# Patient Record
Sex: Male | Born: 1975 | Hispanic: Yes | Marital: Married | Smoking: Never smoker
Health system: Southern US, Community
[De-identification: ages and names within clinical notes are randomized; demographics above are authoritative.]

---

## 2011-12-09 ENCOUNTER — Emergency Department: Payer: Self-pay | Admitting: Emergency Medicine

## 2011-12-09 LAB — COMPREHENSIVE METABOLIC PANEL
Albumin: 4 g/dL (ref 3.4–5.0)
Alkaline Phosphatase: 97 U/L (ref 50–136)
BUN: 13 mg/dL (ref 7–18)
Bilirubin,Total: 0.4 mg/dL (ref 0.2–1.0)
Co2: 25 mmol/L (ref 21–32)
Creatinine: 0.81 mg/dL (ref 0.60–1.30)
EGFR (African American): >60
Glucose: 99 mg/dL (ref 65–99)
SGPT (ALT): 39 U/L (ref 12–78)
Sodium: 140 mmol/L (ref 136–145)
Total Protein: 7.5 g/dL (ref 6.4–8.2)

## 2011-12-09 LAB — URINALYSIS, COMPLETE
Bacteria: NONE SEEN
Blood: NEGATIVE
Glucose,UR: NEGATIVE mg/dL (ref 0–75)
Leukocyte Esterase: NEGATIVE
Nitrite: NEGATIVE
Ph: 5 (ref 4.5–8.0)
Protein: NEGATIVE
Specific Gravity: 1.02 (ref 1.003–1.030)

## 2011-12-09 LAB — CK TOTAL AND CKMB (NOT AT ARMC)
CK, Total: 258 U/L — ABNORMAL HIGH (ref 35–232)
CK-MB: 2.1 ng/mL (ref 0.5–3.6)

## 2011-12-09 LAB — CBC
HCT: 47.2 % (ref 40.0–52.0)
HGB: 16.5 g/dL (ref 13.0–18.0)
MCHC: 34.9 g/dL (ref 32.0–36.0)
MCV: 90 fL (ref 80–100)
Platelet: 263 10*3/uL (ref 150–440)
RBC: 5.23 10*6/uL (ref 4.40–5.90)
RDW: 13.3 % (ref 11.5–14.5)
WBC: 5.7 10*3/uL (ref 3.8–10.6)

## 2011-12-09 LAB — TROPONIN I
Troponin-I: <0.02 ng/mL
Troponin-I: <0.02 ng/mL

## 2011-12-09 LAB — PROTIME-INR
INR: 0.9
Prothrombin Time: 12.2 secs (ref 11.5–14.7)

## 2011-12-09 LAB — PHOSPHORUS: Phosphorus: 3 mg/dL (ref 2.5–4.9)

## 2013-10-26 IMAGING — CR DG CHEST 2V
1 series · 2 of 2 positions shown · non-contrast
Comparison: none

REASON FOR EXAM: cp
COMMENTS:   May transport without cardiac monitor

PROCEDURE:     DXR - DXR CHEST PA (OR AP) AND LATERAL  - December 09, 2011  [DATE]
RESULT:     Comparison: None

[Series 1: pa · 0.17mm/px · 2 of 2 slices shown]
[im 1/2]
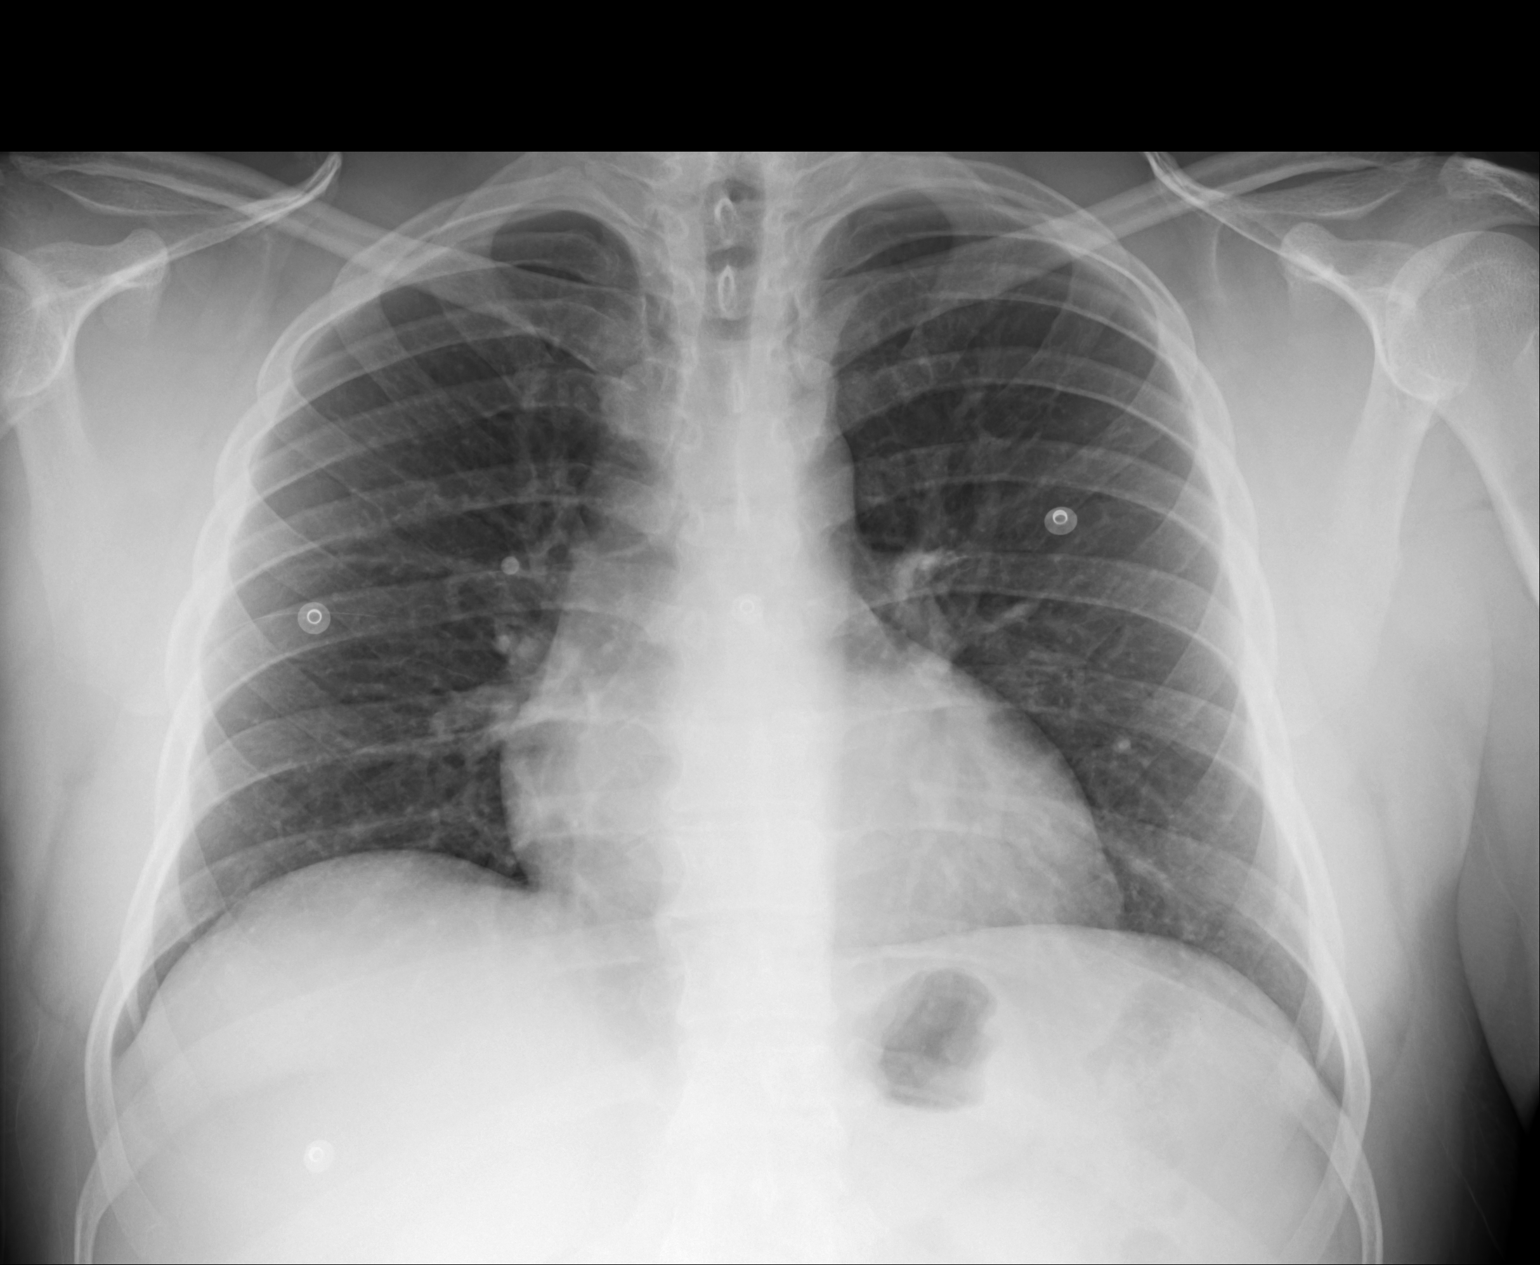
[im 2/2]
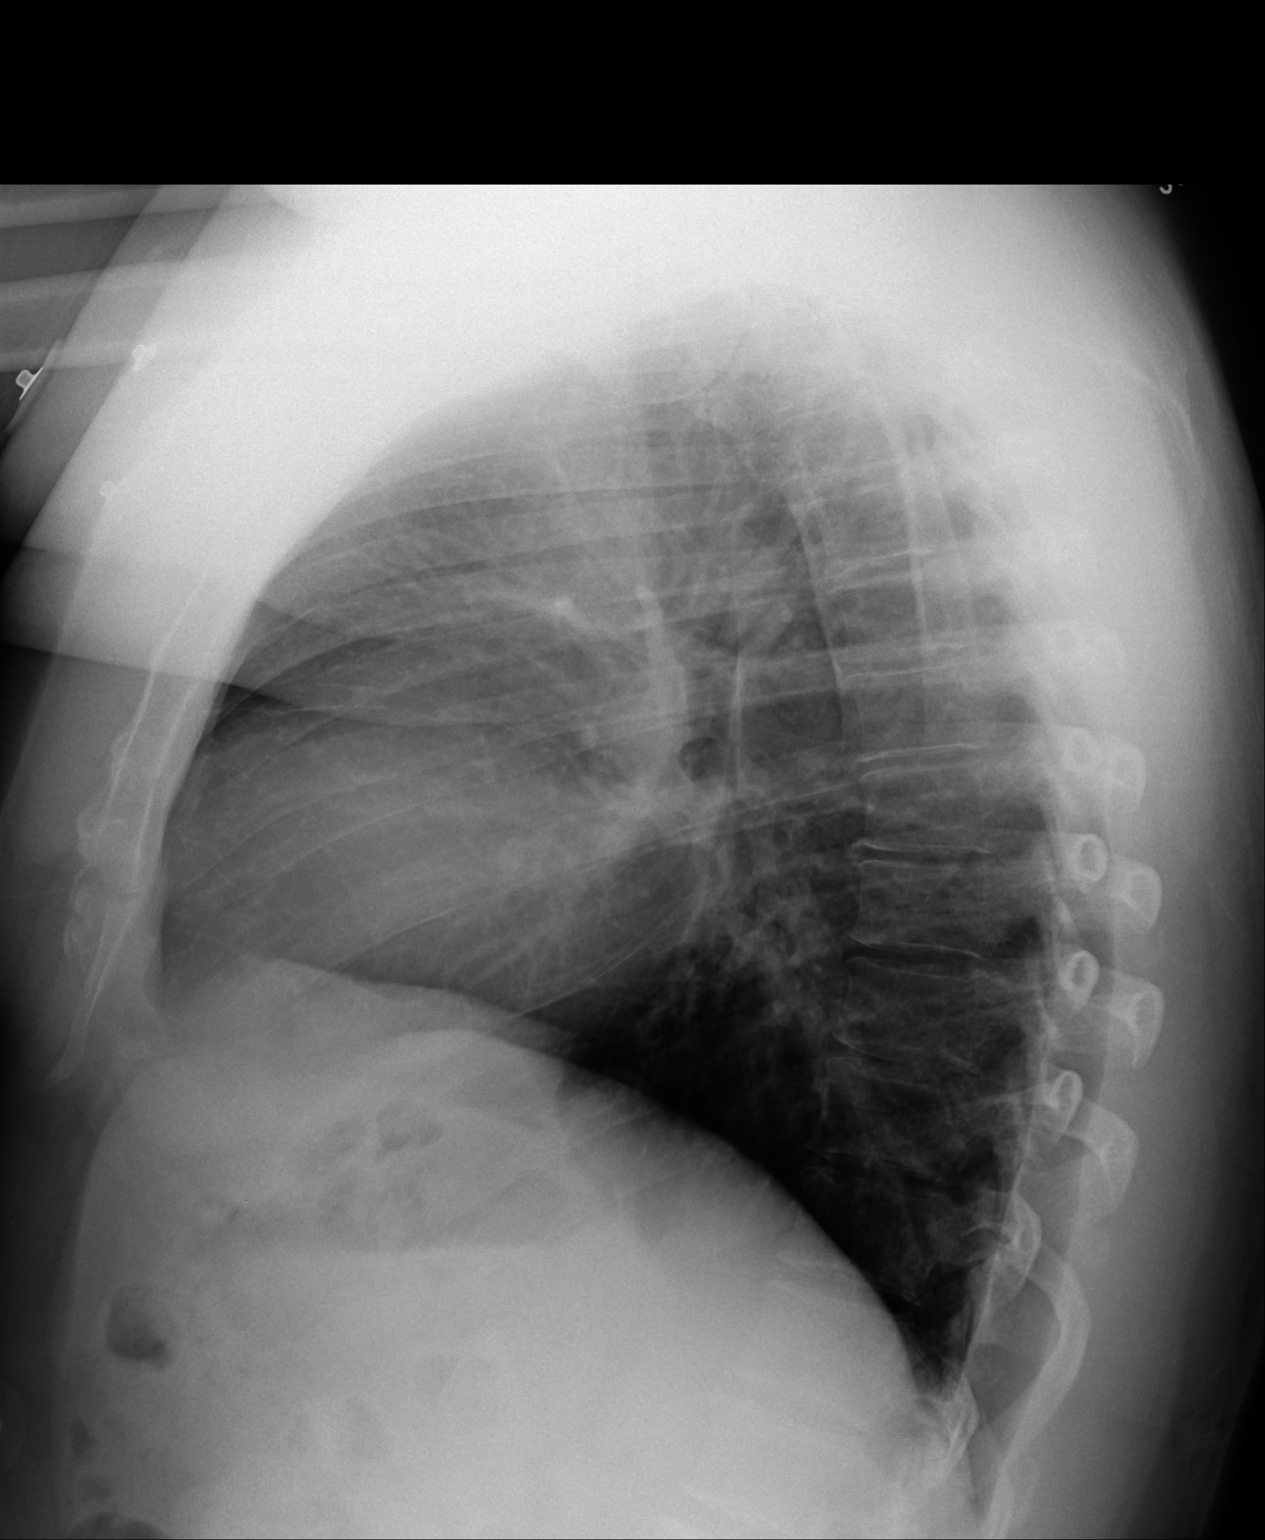

[2 of 2 positions shown; findings below may reference images not displayed]

FINDINGS: PA and lateral chest radiographs are provided.  There is no focal
parenchymal opacity, pleural effusion, or pneumothorax. The heart and
mediastinum are unremarkable.  The osseous structures are unremarkable.
IMPRESSION: No acute disease of the che[REDACTED]

## 2013-11-01 ENCOUNTER — Emergency Department: Payer: Self-pay | Admitting: Emergency Medicine

## 2013-11-01 LAB — URINALYSIS, COMPLETE
BILIRUBIN, UR: NEGATIVE
Glucose,UR: NEGATIVE mg/dL (ref 0–75)
Ketone: NEGATIVE
Leukocyte Esterase: NEGATIVE
Nitrite: NEGATIVE
Ph: 8 (ref 4.5–8.0)
Protein: 30
Specific Gravity: 1.024 (ref 1.003–1.030)
Squamous Epithelial: 5

## 2013-11-01 LAB — CBC
HCT: 44.3 % (ref 40.0–52.0)
HGB: 15.1 g/dL (ref 13.0–18.0)
MCH: 31.1 pg (ref 26.0–34.0)
MCHC: 34 g/dL (ref 32.0–36.0)
MCV: 92 fL (ref 80–100)
PLATELETS: 278 10*3/uL (ref 150–440)
RBC: 4.84 10*6/uL (ref 4.40–5.90)
RDW: 13.1 % (ref 11.5–14.5)
WBC: 7.9 10*3/uL (ref 3.8–10.6)

## 2013-11-01 LAB — COMPREHENSIVE METABOLIC PANEL
ALK PHOS: 68 U/L
Albumin: 3.8 g/dL (ref 3.4–5.0)
Anion Gap: 9 (ref 7–16)
BUN: 13 mg/dL (ref 7–18)
Bilirubin,Total: 0.5 mg/dL (ref 0.2–1.0)
CALCIUM: 8.5 mg/dL (ref 8.5–10.1)
Chloride: 101 mmol/L (ref 98–107)
Co2: 31 mmol/L (ref 21–32)
Creatinine: 1.21 mg/dL (ref 0.60–1.30)
EGFR (African American): >60
EGFR (Non-African Amer.): >60
Glucose: 145 mg/dL — ABNORMAL HIGH (ref 65–99)
OSMOLALITY: 284 (ref 275–301)
POTASSIUM: 3.4 mmol/L — AB (ref 3.5–5.1)
SGOT(AST): 35 U/L (ref 15–37)
SGPT (ALT): 49 U/L
Sodium: 141 mmol/L (ref 136–145)
Total Protein: 7.4 g/dL (ref 6.4–8.2)

## 2015-10-16 ENCOUNTER — Emergency Department: Payer: Self-pay

## 2015-10-16 ENCOUNTER — Encounter: Payer: Self-pay | Admitting: Emergency Medicine

## 2015-10-16 ENCOUNTER — Emergency Department
Admission: EM | Admit: 2015-10-16 | Discharge: 2015-10-16 | Disposition: A | Discharge status: Home / Self Care | Payer: Self-pay | Attending: Emergency Medicine | Admitting: Emergency Medicine

## 2015-10-16 DIAGNOSIS — G510 Bell's palsy: Secondary | ICD-10-CM | POA: Insufficient documentation

## 2015-10-16 DIAGNOSIS — H11001 Unspecified pterygium of right eye: Secondary | ICD-10-CM | POA: Insufficient documentation

## 2015-10-16 LAB — CBC WITH DIFFERENTIAL/PLATELET
Basophils Absolute: 0 10*3/uL (ref 0–0.1)
Basophils Relative: 0 %
EOS PCT: 3 %
Eosinophils Absolute: 0.2 10*3/uL (ref 0–0.7)
HEMATOCRIT: 44.4 % (ref 40.0–52.0)
Hemoglobin: 15.7 g/dL (ref 13.0–18.0)
LYMPHS ABS: 1.7 10*3/uL (ref 1.0–3.6)
LYMPHS PCT: 30 %
MCH: 31.8 pg (ref 26.0–34.0)
MCHC: 35.4 g/dL (ref 32.0–36.0)
MCV: 89.8 fL (ref 80.0–100.0)
MONO ABS: 0.4 10*3/uL (ref 0.2–1.0)
Monocytes Relative: 8 %
Neutro Abs: 3.4 10*3/uL (ref 1.4–6.5)
Neutrophils Relative %: 59 %
PLATELETS: 246 10*3/uL (ref 150–440)
RBC: 4.94 MIL/uL (ref 4.40–5.90)
RDW: 13 % (ref 11.5–14.5)
WBC: 5.9 10*3/uL (ref 3.8–10.6)

## 2015-10-16 LAB — COMPREHENSIVE METABOLIC PANEL
ALBUMIN: 4.2 g/dL (ref 3.5–5.0)
ALT: 56 U/L (ref 17–63)
AST: 35 U/L (ref 15–41)
Alkaline Phosphatase: 60 U/L (ref 38–126)
Anion gap: 7 (ref 5–15)
BUN: 14 mg/dL (ref 6–20)
CHLORIDE: 105 mmol/L (ref 101–111)
CO2: 25 mmol/L (ref 22–32)
Calcium: 9.2 mg/dL (ref 8.9–10.3)
Creatinine, Ser: 0.79 mg/dL (ref 0.61–1.24)
GFR calc Af Amer: >60 mL/min (ref >60)
GFR calc non Af Amer: >60 mL/min (ref >60)
GLUCOSE: 145 mg/dL — AB (ref 65–99)
POTASSIUM: 3.5 mmol/L (ref 3.5–5.1)
SODIUM: 137 mmol/L (ref 135–145)
Total Bilirubin: 0.6 mg/dL (ref 0.3–1.2)
Total Protein: 6.9 g/dL (ref 6.5–8.1)

## 2015-10-16 MED ORDER — TETRACAINE HCL 0.5 % OP SOLN
1.0000 [drp] | Freq: Once | OPHTHALMIC | Status: AC
  Administered 2015-10-16: 1 [drp] via OPHTHALMIC
  Filled 2015-10-16: qty 2

## 2015-10-16 MED ORDER — HYPROMELLOSE (GONIOSCOPIC) 2.5 % OP SOLN: 1.0000 [drp] | Freq: Four times a day (QID) | OPHTHALMIC | 12 refills | Status: AC

## 2015-10-16 MED ORDER — PREDNISONE 20 MG PO TABS
60.0000 mg | ORAL_TABLET | Freq: Once | ORAL | Status: AC
  Administered 2015-10-16: 60 mg via ORAL
  Filled 2015-10-16: qty 3

## 2015-10-16 MED ORDER — VALACYCLOVIR HCL 1 G PO TABS: 1000.0000 mg | ORAL_TABLET | Freq: Three times a day (TID) | ORAL | 0 refills | Status: AC

## 2015-10-16 MED ORDER — FLUORESCEIN SODIUM 1 MG OP STRP
1.0000 | ORAL_STRIP | Freq: Once | OPHTHALMIC | Status: AC
  Administered 2015-10-16: 1 via OPHTHALMIC
  Filled 2015-10-16: qty 1

## 2015-10-16 MED ORDER — PREDNISONE 20 MG PO TABS: 60.0000 mg | ORAL_TABLET | Freq: Every day | ORAL | 0 refills | Status: AC

## 2015-10-16 MED ORDER — IBUPROFEN 600 MG PO TABS
600.0000 mg | ORAL_TABLET | Freq: Once | ORAL | Status: AC
  Administered 2015-10-16: 600 mg via ORAL
  Filled 2015-10-16: qty 1

## 2015-10-16 NOTE — ED Notes (Signed)

## 2015-10-16 NOTE — ED Provider Notes (Signed)
[facility] Emergency Department Provider Note  ____________________________________________  Time seen: Approximately 7:44 PM  I have reviewed the triage vital signs and the nursing notes.   HISTORY  Chief Complaint Migraine and Numbness   HPI Richard Daniels is a 40 y.o. male no significant past medical history who presents for evaluation of headache and facial droop. Patient reports 2 days of pain surrounding his right ear and his right eye, moderate to severe, associated with photophobia, burning sensation to his eye, and redness of his eye. He denies any foreign body sensation or trauma to his eye or changes in vision. Today at 5PM he noticed numbness to the right side of his face and when he looked in the mirror he noticed facial droop. He denies any history of shingles or chickenpox or herpes, any rash or fever. He does not smoke. No family history of stroke. No tick bites.  History reviewed. No pertinent past medical history.  There are no active problems to display for this patient.   History reviewed. No pertinent surgical history.  Prior to Admission medications   Medication Sig Start Date End Date Taking? Authorizing Provider  hydroxypropyl methylcellulose / hypromellose (ISOPTO TEARS / GONIOVISC) 2.5 % ophthalmic solution Place 1 drop into the right eye 4 (four) times daily. 10/16/15   Nita Sickle, MD  predniSONE (DELTASONE) 20 MG tablet Take 3 tablets (60 mg total) by mouth daily. 10/16/15 10/23/15  Nita Sickle, MD  valACYclovir (VALTREX) 1000 MG tablet Take 1 tablet (1,000 mg total) by mouth 3 (three) times daily. 10/16/15 10/23/15  Nita Sickle, MD    Allergies Review of patient's allergies indicates no known allergies.  No family history on file.  Social History Social History  Substance Use Topics  . Smoking status: Never Smoker  . Smokeless tobacco: Not on file  . Alcohol use No    Review of  Systems  Constitutional: Negative for fever. Eyes: Negative for visual changes. + eye burning, pain, and redness ENT: Negative for sore throat. Cardiovascular: Negative for chest pain. Respiratory: Negative for shortness of breath. Gastrointestinal: Negative for abdominal pain, vomiting or diarrhea. Genitourinary: Negative for dysuria. Musculoskeletal: Negative for back pain. Skin: Negative for rash. Neurological: Negative for headaches, weakness or numbness. + R facial droop  ____________________________________________   PHYSICAL EXAM:  VITAL SIGNS: ED Triage Vitals [10/16/15 1905]  Enc Vitals Group     BP (!) 144/79     Pulse Rate 90     Resp 18     Temp 97.7 F (36.5 C)     Temp Source Oral     SpO2 97 %     Weight 240 lb (108.9 kg)     Height 5\' 7"  (1.702 m)     Head Circumference      Peak Flow      Pain Score 8     Pain Loc      Pain Edu?      Excl. in GC?     Constitutional: Alert and oriented. Well appearing and in no apparent distress. HEENT:      Head: Normocephalic and atraumatic.         EYE EXAM: EOMI and not painful. PERRL bilaterally. Intact consensual light reflex. R sided photophobia. Conjunctivae is injected in the right with pterygium. No edema . Sclerae in anicteric. Eye lid everted and no foreign objects or stye observed. Visual fields are intact. Visual acuity is 20/70 on the right and 20/50 on the  left. No stye or chalazion. Ocular pressure 14 on the R eye. No fluorescin uptake observed with Wood's lamp. No blepharitis. No erythema surrounding the eye      Mouth/Throat: Mucous membranes are moist.       Ear: External ear canal clear with no infection, TM normal      Neck: Supple with no signs of meningismus. Cardiovascular: Regular rate and rhythm. No murmurs, gallops, or rubs. 2+ symmetrical distal pulses are present in all extremities. No JVD. Respiratory: Normal respiratory effort. Lungs are clear to auscultation bilaterally. No wheezes,  crackles, or rhonchi.  Gastrointestinal: Soft, non tender, and non distended with positive bowel sounds. No rebound or guarding. Genitourinary: No CVA tenderness. Musculoskeletal: Nontender with normal range of motion in all extremities. No edema, cyanosis, or erythema of extremities. Neurologic: R sided facial droop sparing the forehead. A & O x3, PERRL, no nystagmus, motor testing reveals good tone and bulk throughout. There is no evidence of pronator drift or dysmetria. Muscle strength is 5/5 throughout. Deep tendon reflexes are 2+ throughout with downgoing toes. Sensory examination is intact. Gait is normal. Skin: Skin is warm, dry and intact. No rash noted. Psychiatric: Mood and affect are normal. Speech and behavior are normal.  ____________________________________________   LABS (all labs ordered are listed, but only abnormal results are displayed)  Labs Reviewed  COMPREHENSIVE METABOLIC PANEL - Abnormal; Notable for the following:       Result Value   Glucose, Bld 145 (*)    All other components within normal limits  CBC WITH DIFFERENTIAL/PLATELET  VARICELLA ZOSTER ANTIBODY, IGG  B. BURGDORFI ANTIBODIES   ____________________________________________  EKG  none ____________________________________________  RADIOLOGY  Head CT: negative  ____________________________________________   PROCEDURES  Procedure(s) performed: None Procedures Critical Care performed:  None ____________________________________________   INITIAL IMPRESSION / ASSESSMENT AND PLAN / ED COURSE  40 y.o. male no significant past medical history who presents for evaluation of forehead sparring R sided facial droop in the setting of two days of R eye pain, redness, and burning. Patient with pterygium on exam and bell's palsy concerning for  Herpes zoster ophthalmicus. NO rash, ear exam normal.    Clinical Course  Comment By Time  Patient evaluated by Dr. Willey Blade, ophthalmology who agrees  with no dendritic findings on the cornea and recommended treating the Bell's palsy and following up with him in the clinic in a week for reevaluation. Also recommended sending titers for Lyme disease and VZV which were sent. Patient be discharged home with acyclovir, prednisone, and eye care. Nita Sickle, MD 07/31 2119    Pertinent labs & imaging results that were available during my care of the patient were reviewed by me and considered in my medical decision making (see chart for details).    ____________________________________________   FINAL CLINICAL IMPRESSION(S) / ED DIAGNOSES  Final diagnoses:  Bell's palsy  Pterygium eye, right      NEW MEDICATIONS STARTED DURING THIS VISIT:  New Prescriptions   HYDROXYPROPYL METHYLCELLULOSE / HYPROMELLOSE (ISOPTO TEARS / GONIOVISC) 2.5 % OPHTHALMIC SOLUTION    Place 1 drop into the right eye 4 (four) times daily.   PREDNISONE (DELTASONE) 20 MG TABLET    Take 3 tablets (60 mg total) by mouth daily.   VALACYCLOVIR (VALTREX) 1000 MG TABLET    Take 1 tablet (1,000 mg total) by mouth 3 (three) times daily.     Note:  This document was prepared using Dragon voice recognition software and may include unintentional dictation  errors.    Nita Sickle, MD 10/16/15 2133

## 2015-10-16 NOTE — Discharge Instructions (Signed)
Tome prednisona y valaciclovir segn lo prescrito. El cuidado de los ojos es lo ms importante para prevenir lesiones en el ojo. Aplique gotas artificiales en el ojo derecho varias veces al da. Por la noche, aplique gotas y [place] su ojo con un escudo para Company secretary. Si sus sntomas estn empeorando o si tiene cambios en la visin, debe realizar un seguimiento con la oftalmologa de forma inesperada, de lo contrario seguir con ellos en una semana para su reevaluacin.   Take prednisone and valacyclovir as prescribed.  Eye care is the most important thing to prevent injury to your eye. Apply artificial drops to the right eye multiple times a day. At night, apply drops and cover your eye with a shield to prevent injury. If your symptoms are getting worse or you have changes in vision you should follow up with ophthalmology emergently otherwise follow up with them in 1 week for re-evaluation.

## 2015-10-16 NOTE — ED Triage Notes (Signed)
Headache x 2 days. No head injury. States R eye reddened and watery x 2 days. Now noted with R facial weakness, R sided mouth droop, R side unable to to raise brow. Grips equal.

## 2015-10-18 LAB — B. BURGDORFI ANTIBODIES

## 2015-10-18 LAB — VARICELLA ZOSTER ANTIBODY, IGG: VARICELLA IGG: 632 {index} (ref >165)

## 2017-09-02 IMAGING — CT CT HEAD W/O CM
3 series · 16 of 47 positions shown, 19 images · non-contrast
Comparison: None.

CLINICAL DATA: Headache for 3 days. Right facial droop today.
Initial encounter.

EXAM:
CT HEAD WITHOUT CONTRAST
TECHNIQUE: Contiguous axial images were obtained from the base of the skull
through the vertex without intravenous contrast.

[Series 2: head wo · axial · 0.49mm/px · z∈[-131,-6]mm · 10 of 30 slices shown, 13 images]
[im 3/30  brain]
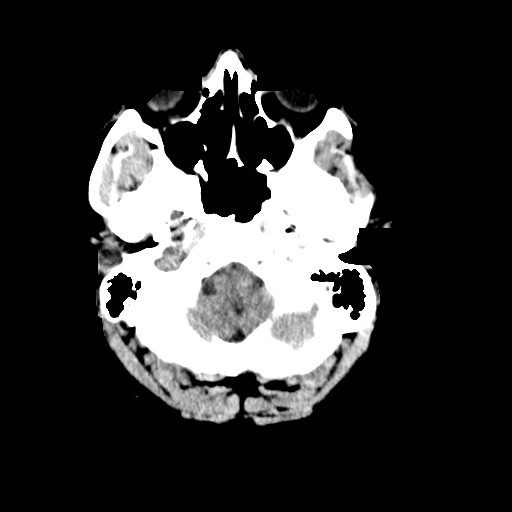
[im 3/30  bone]
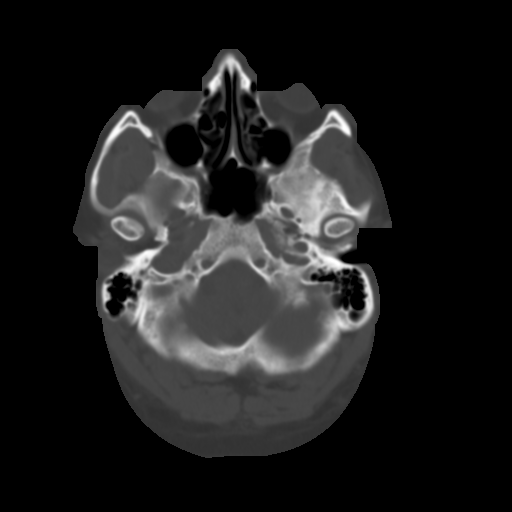
[im 6/30  brain]
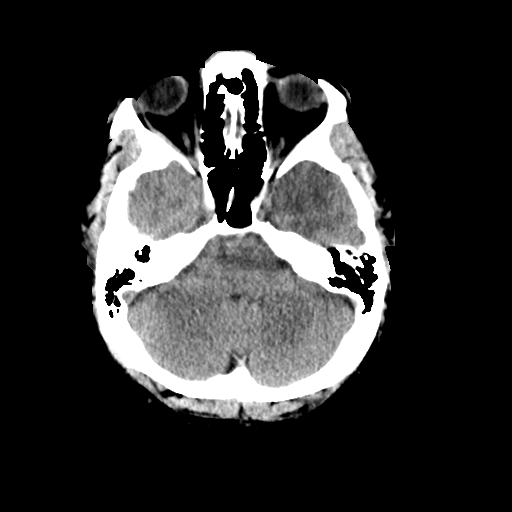
[im 9/30  brain]
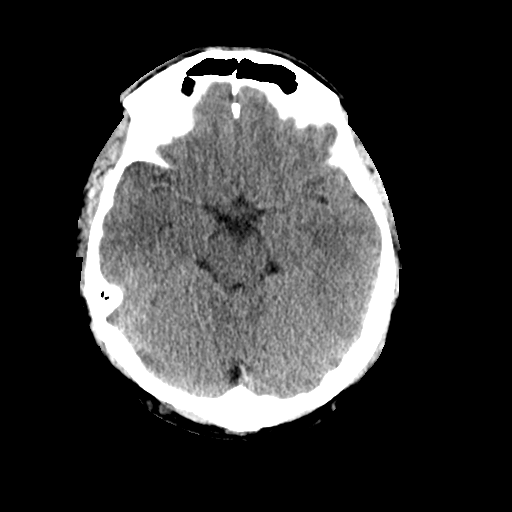
[im 11/30  brain]
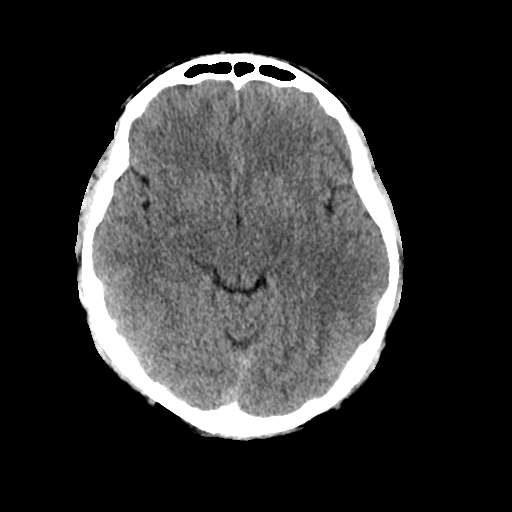
[im 14/30  brain]
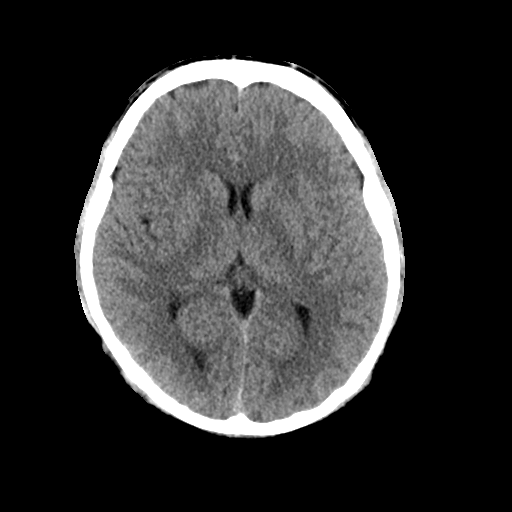
[im 14/30  bone]
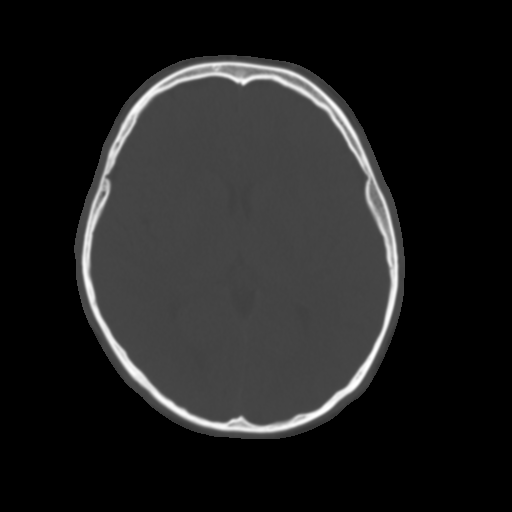
[im 17/30  brain]
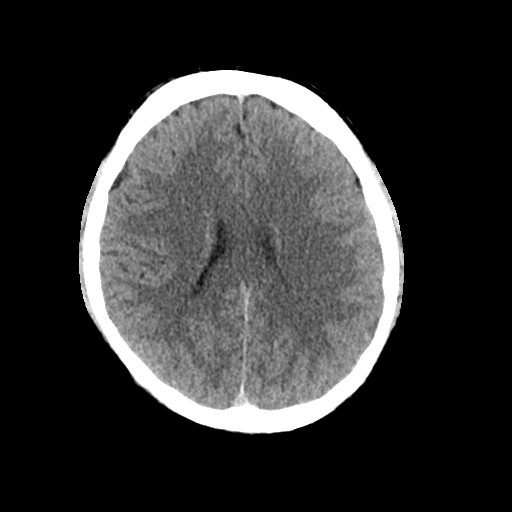
[im 20/30  brain]
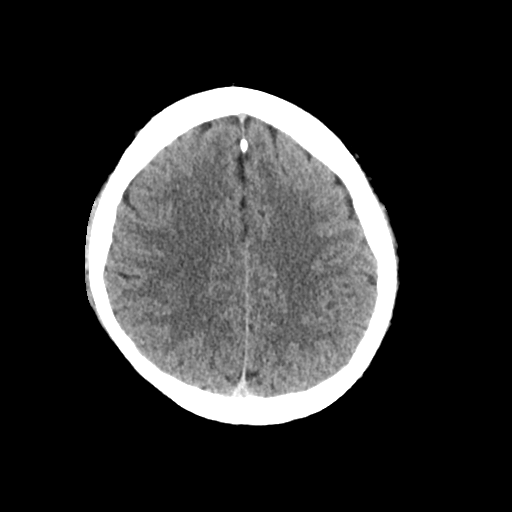
[im 23/30  brain]
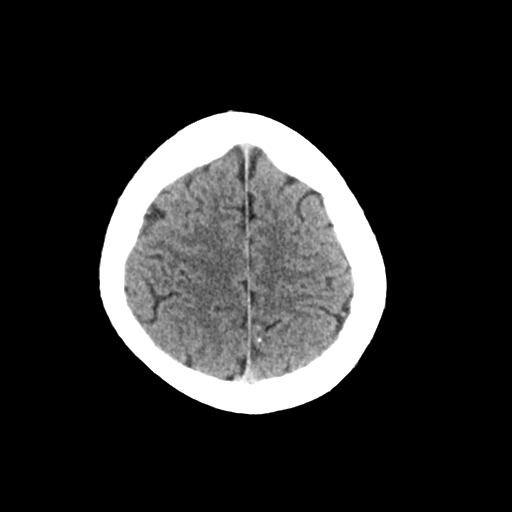
[im 25/30  brain]
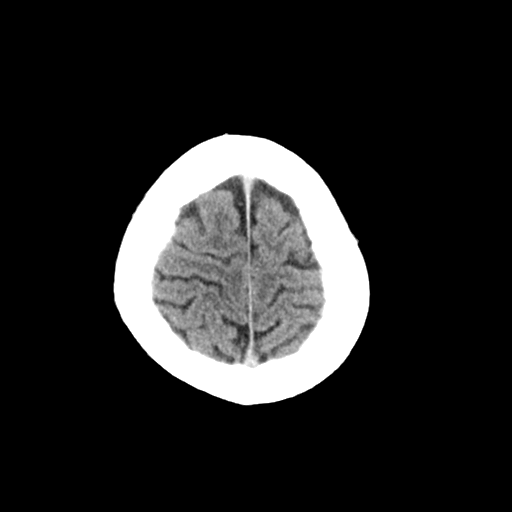
[im 25/30  bone]
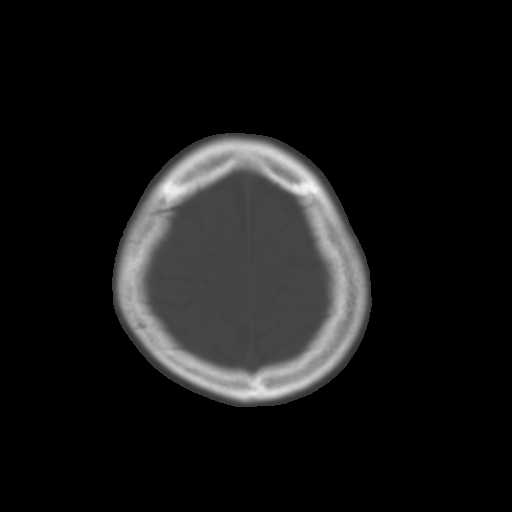
[im 28/30  brain]
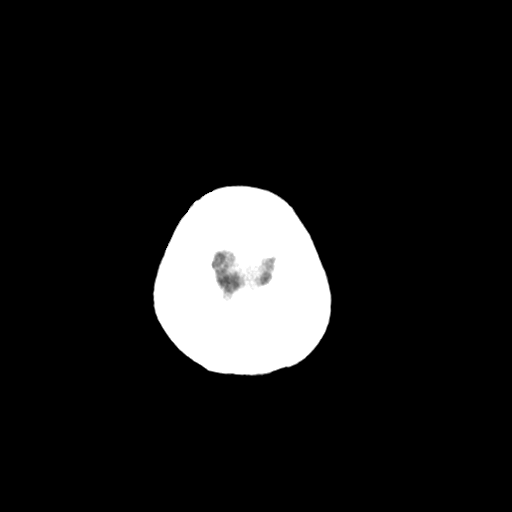

[Series 4: coronal soft tissue · coronal · 0.29mm/px · 3 of 67 slices shown]
[im 23/67  brain]
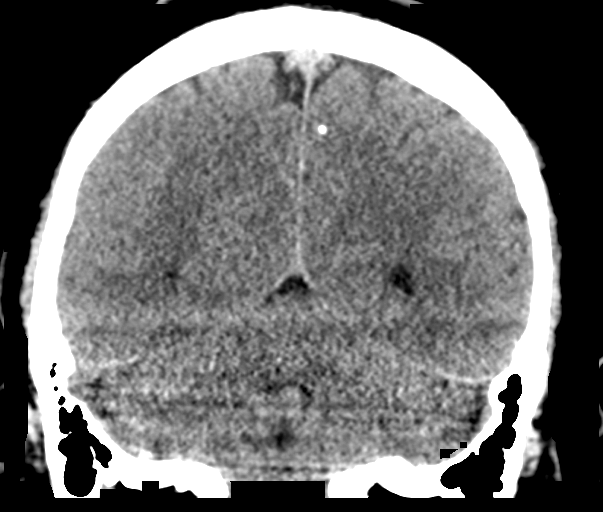
[im 30/67  brain]
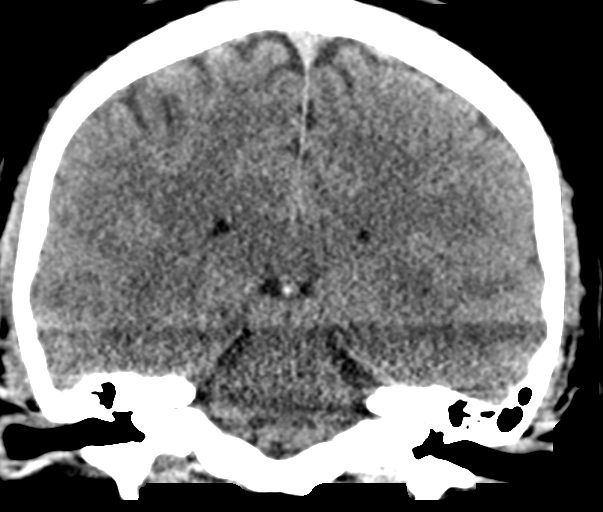
[im 37/67  brain]
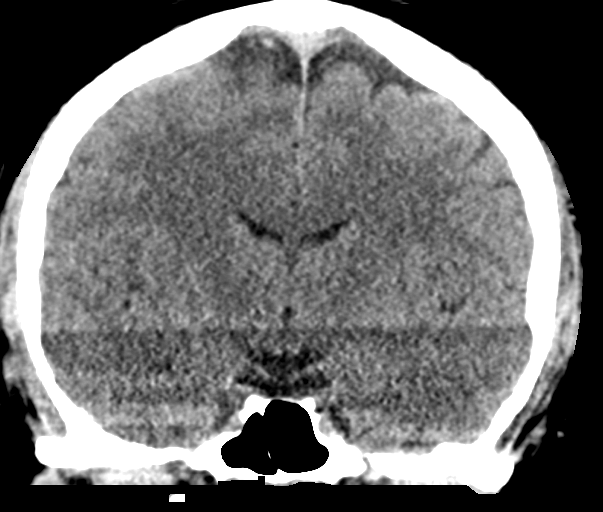

[Series 5: sagittal soft tissue · sagittal · 0.29mm/px · 3 of 59 slices shown]
[im 20/59  brain]
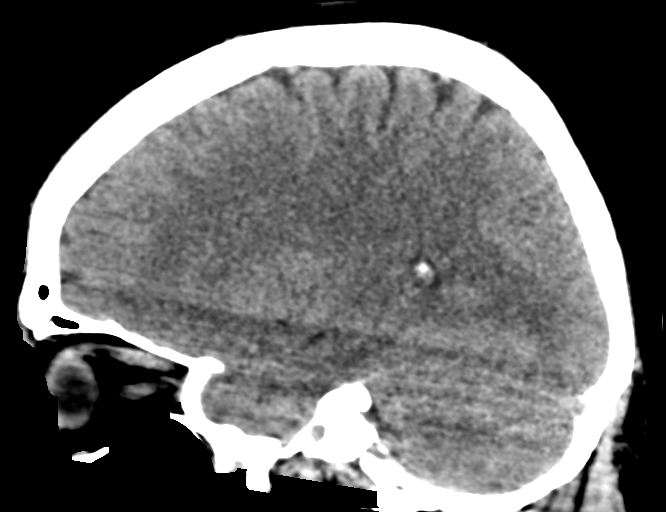
[im 30/59  brain]
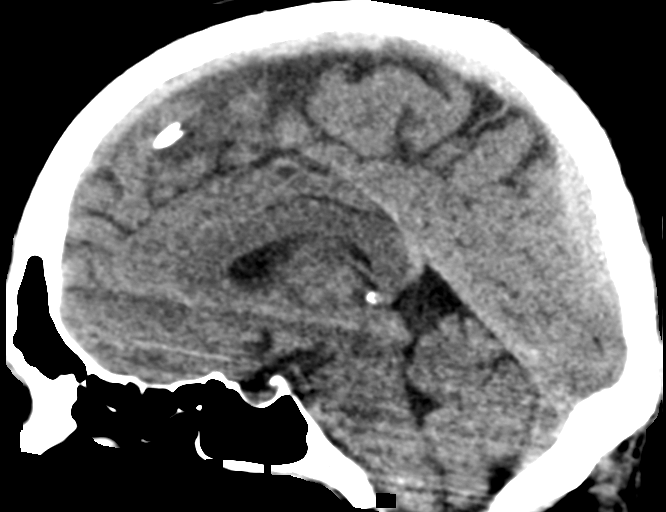
[im 39/59  brain]
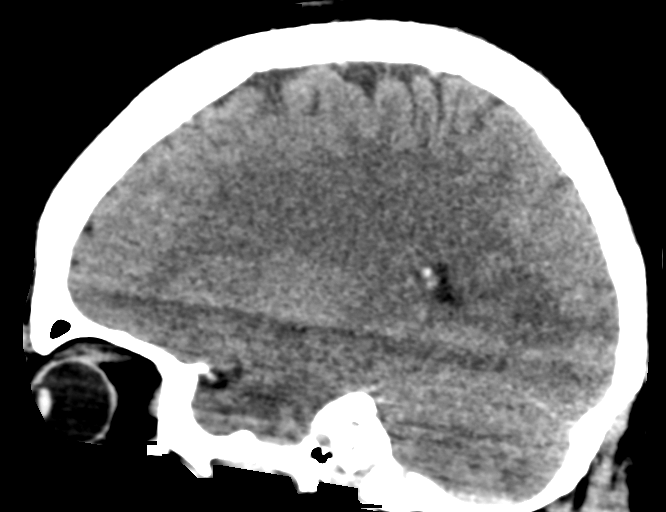

[16 of 47 positions shown; findings below may reference images not displayed]

FINDINGS: The brain appears normal without hemorrhage, infarct, mass lesion,
mass effect, midline shift or abnormal extra-axial fluid collection.
No hydrocephalus or pneumocephalus. The calvarium is intact. Imaged
paranasal sinuses and mastoid air cells are clear.
IMPRESSION: Negative head CT.
